# Patient Record
Sex: Male | Born: 2000 | Hispanic: No | Marital: Single | Smoking: Never smoker
Health system: Southern US, Community
[De-identification: ages and names within clinical notes are randomized; demographics above are authoritative.]

---
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
## 2021-01-24 ENCOUNTER — Emergency Department (HOSPITAL_COMMUNITY)
Admission: EM | Admit: 2021-01-24 | Discharge: 2021-01-24 | Disposition: A | Discharge status: Home / Self Care | Payer: BC Managed Care – PPO | Attending: Emergency Medicine | Admitting: Emergency Medicine

## 2021-01-24 ENCOUNTER — Emergency Department (HOSPITAL_COMMUNITY): Payer: BC Managed Care – PPO

## 2021-01-24 DIAGNOSIS — F10129 Alcohol abuse with intoxication, unspecified: Secondary | ICD-10-CM | POA: Insufficient documentation

## 2021-01-24 DIAGNOSIS — R1110 Vomiting, unspecified: Secondary | ICD-10-CM | POA: Diagnosis present

## 2021-01-24 DIAGNOSIS — F10920 Alcohol use, unspecified with intoxication, uncomplicated: Secondary | ICD-10-CM

## 2021-01-24 LAB — COMPREHENSIVE METABOLIC PANEL
ALT: 66 U/L — ABNORMAL HIGH (ref 0–44)
AST: 79 U/L — ABNORMAL HIGH (ref 15–41)
Albumin: 4.2 g/dL (ref 3.5–5.0)
Alkaline Phosphatase: 94 U/L (ref 38–126)
Anion gap: 12 (ref 5–15)
BUN: 14 mg/dL (ref 6–20)
CO2: 23 mmol/L (ref 22–32)
Calcium: 8.9 mg/dL (ref 8.9–10.3)
Chloride: 106 mmol/L (ref 98–111)
Creatinine, Ser: 1.13 mg/dL (ref 0.61–1.24)
GFR, Estimated: >60 mL/min (ref >60)
Glucose, Bld: 135 mg/dL — ABNORMAL HIGH (ref 70–99)
Potassium: 3.8 mmol/L (ref 3.5–5.1)
Sodium: 141 mmol/L (ref 135–145)
Total Bilirubin: 0.6 mg/dL (ref 0.3–1.2)
Total Protein: 6.7 g/dL (ref 6.5–8.1)

## 2021-01-24 LAB — CBC WITH DIFFERENTIAL/PLATELET
Abs Immature Granulocytes: 0.04 10*3/uL (ref 0.00–0.07)
Basophils Absolute: 0 10*3/uL (ref 0.0–0.1)
Basophils Relative: 0 %
Eosinophils Absolute: 0 10*3/uL (ref 0.0–0.5)
Eosinophils Relative: 0 %
HCT: 44 % (ref 39.0–52.0)
Hemoglobin: 14.7 g/dL (ref 13.0–17.0)
Immature Granulocytes: 0 %
Lymphocytes Relative: 9 %
Lymphs Abs: 1 10*3/uL (ref 0.7–4.0)
MCH: 30.6 pg (ref 26.0–34.0)
MCHC: 33.4 g/dL (ref 30.0–36.0)
MCV: 91.5 fL (ref 80.0–100.0)
Monocytes Absolute: 0.3 10*3/uL (ref 0.1–1.0)
Monocytes Relative: 3 %
Neutro Abs: 10.3 10*3/uL — ABNORMAL HIGH (ref 1.7–7.7)
Neutrophils Relative %: 88 %
Platelets: 246 10*3/uL (ref 150–400)
RBC: 4.81 MIL/uL (ref 4.22–5.81)
RDW: 12.3 % (ref 11.5–15.5)
WBC: 11.8 10*3/uL — ABNORMAL HIGH (ref 4.0–10.5)
nRBC: 0 % (ref 0.0–0.2)

## 2021-01-24 LAB — CBG MONITORING, ED: Glucose-Capillary: 134 mg/dL — ABNORMAL HIGH (ref 70–99)

## 2021-01-24 LAB — ACETAMINOPHEN LEVEL: Acetaminophen (Tylenol), Serum: <10 ug/mL — ABNORMAL LOW (ref 10–30)

## 2021-01-24 LAB — ETHANOL: Alcohol, Ethyl (B): 201 mg/dL — ABNORMAL HIGH (ref <10)

## 2021-01-24 LAB — SALICYLATE LEVEL: Salicylate Lvl: <7 mg/dL — ABNORMAL LOW (ref 7.0–30.0)

## 2021-01-24 NOTE — ED Triage Notes (Signed)
Pt BIB [facility] from [facility], per friends pt had multiple shots of tequila, sat down and fell backward. C-collar placed pta, vomiting x 1.

## 2021-01-24 NOTE — ED Provider Notes (Signed)
[facility] EMERGENCY DEPARTMENT Provider Note   CSN: [id number] Arrival date & time: 01/24/21  0263     History Chief Complaint  Patient presents with  . Alcohol Intoxication    Larry Patterson is a 20 y.o. male.  Patient presents to the emergency department with a chief complaint of fall.  He is brought in by EMS from [facility], where patient's friend said that he had multiple shots of tequila, sat down, and fell backward.  Friends report that he had 1 episode of vomiting.  Patient unable to contribute to his history secondary to intoxication.  Level 5 caveat applies.  The history is provided by the patient. No language interpreter was used.       No past medical history on file.  Patient Active Problem List   Diagnosis Date Noted  . Health care maintenance 05/18/2013    No past surgical history on file.     No family history on file.  Social History   Tobacco Use  . Smoking status: Never Smoker  . Smokeless tobacco: Never Used  Substance Use Topics  . Alcohol use: No  . Drug use: No    Home Medications Prior to Admission medications   Not on File    Allergies    Patient has no known allergies.  Review of Systems   Review of Systems  Unable to perform ROS: Mental status change    Physical Exam Updated Vital Signs BP 111/71 (BP Location: Right Arm)   Pulse 84   Temp (!) 97.5 F (36.4 C) (Axillary)   Resp 19   SpO2 98%   Physical Exam Vitals and nursing note reviewed.  Constitutional:      Appearance: He is well-developed.     Comments: Asleep  HENT:     Head: Normocephalic and atraumatic.  Eyes:     Conjunctiva/sclera: Conjunctivae normal.  Neck:     Comments: In c-collar Cardiovascular:     Rate and Rhythm: Normal rate and regular rhythm.     Heart sounds: No murmur heard.   Pulmonary:     Effort: Pulmonary effort is normal. No respiratory distress.     Breath sounds: Normal breath sounds.  Abdominal:      Palpations: Abdomen is soft.     Tenderness: There is no abdominal tenderness.  Musculoskeletal:     Cervical back: Neck supple.     Comments: Moving extremities, no obvious deformity  Skin:    General: Skin is warm and dry.  Neurological:     Comments: Intoxicated Asleep  Psychiatric:     Comments: Unable to assess     ED Results / Procedures / Treatments   Labs (all labs ordered are listed, but only abnormal results are displayed) Labs Reviewed  COMPREHENSIVE METABOLIC PANEL  SALICYLATE LEVEL  ACETAMINOPHEN LEVEL  ETHANOL  RAPID URINE DRUG SCREEN, HOSP PERFORMED  CBC WITH DIFFERENTIAL/PLATELET  CBG MONITORING, ED    EKG None  Radiology No results found.  Procedures Procedures   Medications Ordered in ED Medications - No data to display  ED Course  I have reviewed the triage vital signs and the nursing notes.  Pertinent labs & imaging results that were available during my care of the patient were reviewed by me and considered in my medical decision making (see chart for details).    MDM Rules/Calculators/A&P  Patient here with alcohol intoxication.  He had been drinking tequila shots with his friends tonight, when he fell backward and hit his head.  CT head and cervical spine are negative for acute traumatic injury.  He does have some cervical spondylitic changes.  Patient has been allowed to sober up for several hours in the emergency department.  Alcohol level was elevated.  Patient is now alert and responding to my questions appropriately.  He is working on finding a ride home.  He can be discharged once he finds a ride, or once he is clinically sober. Final Clinical Impression(s) / ED Diagnoses Final diagnoses:  Alcoholic intoxication without complication [facility])    Rx / DC Orders ED Discharge Orders    None       Roxy Horseman, PA-C 01/24/21 0932    Dione Booze, MD 01/24/21 [phone]

## 2022-04-05 IMAGING — CT CT HEAD W/O CM
4 series · 16 of 47 positions shown, 18 images · non-contrast
Comparison: None.

CLINICAL DATA: Heavily intoxicated, head/neck trauma with fall
posterior

EXAM:
CT HEAD WITHOUT CONTRAST
CT CERVICAL SPINE WITHOUT CONTRAST
TECHNIQUE: Multidetector CT imaging of the head and cervical spine was
performed following the standard protocol without intravenous
contrast. Multiplanar CT image reconstructions of the cervical spine
were also generated.

[Series 3: head wo · axial · 0.45mm/px · z∈[+1314,+1444]mm · 7 of 36 slices shown, 9 images]
[im 5/36  brain]
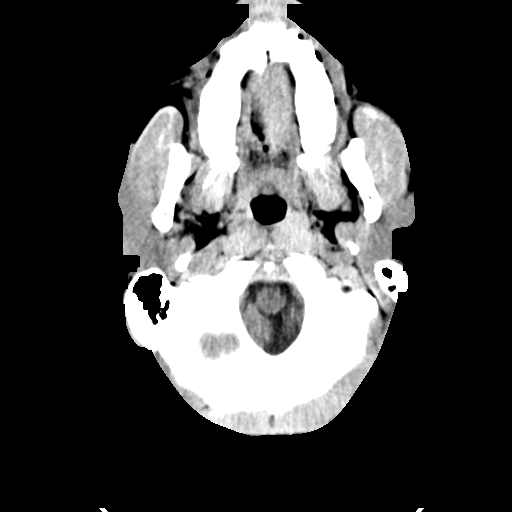
[im 5/36  bone]
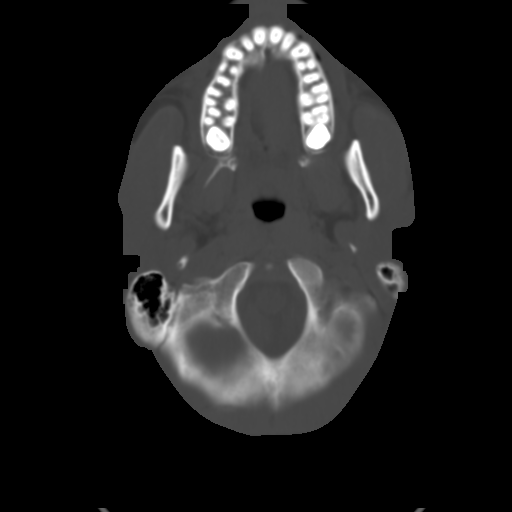
[im 9/36  brain]
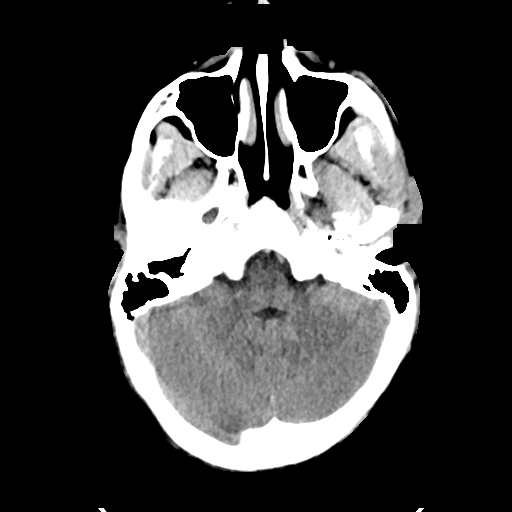
[im 14/36  brain]
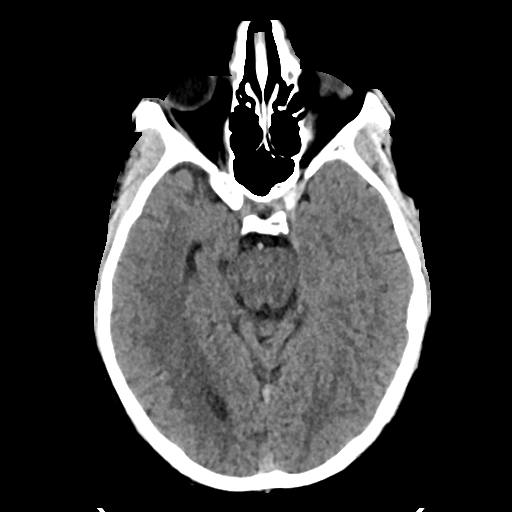
[im 18/36  brain]
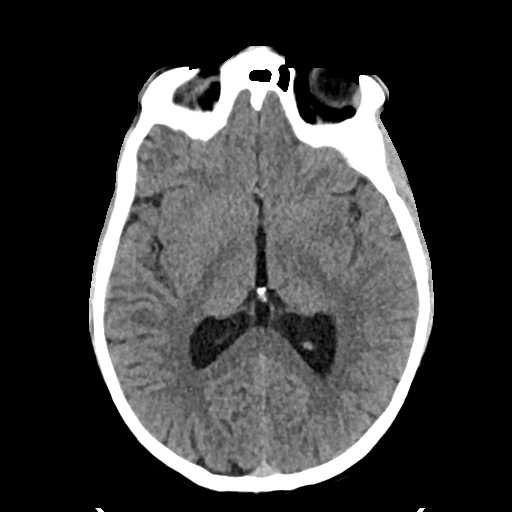
[im 22/36  brain]
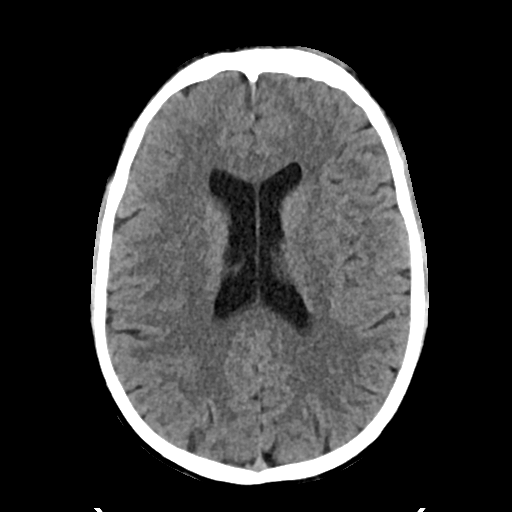
[im 22/36  bone]
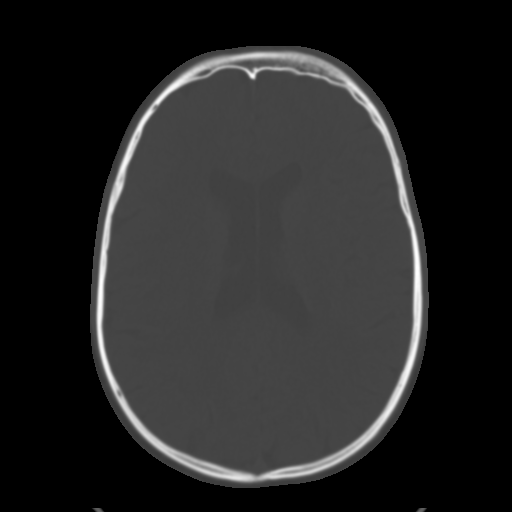
[im 27/36  brain]
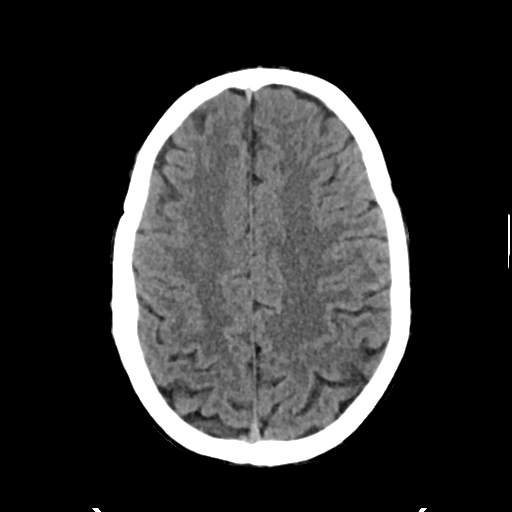
[im 31/36  brain]
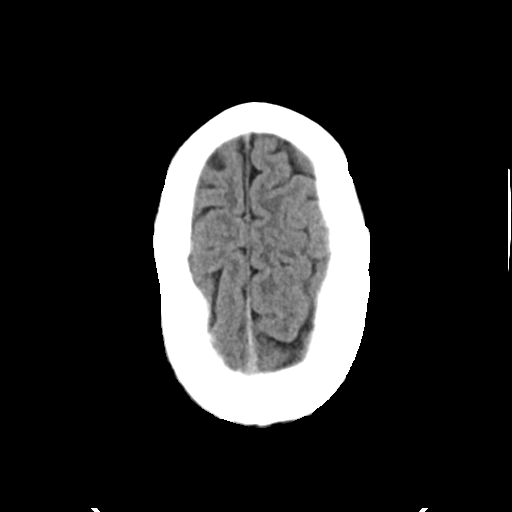

[Series 4: head bone · axial · 0.45mm/px · z∈[+1310,+1346]mm · 3 of 90 slices shown]
[im 9/90  bone]
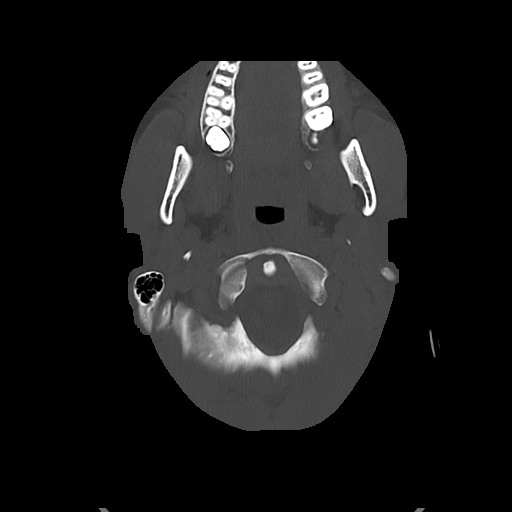
[im 18/90  bone]
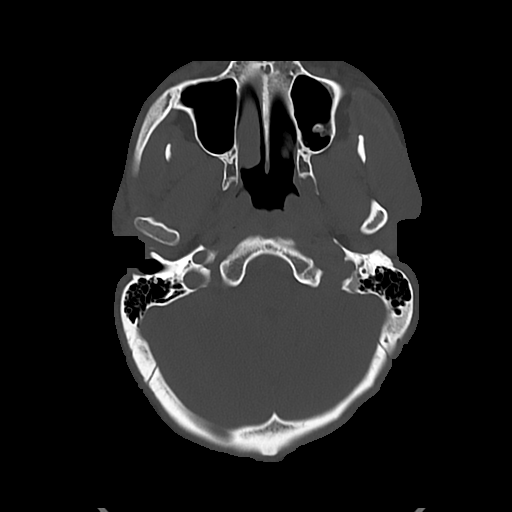
[im 27/90  bone]
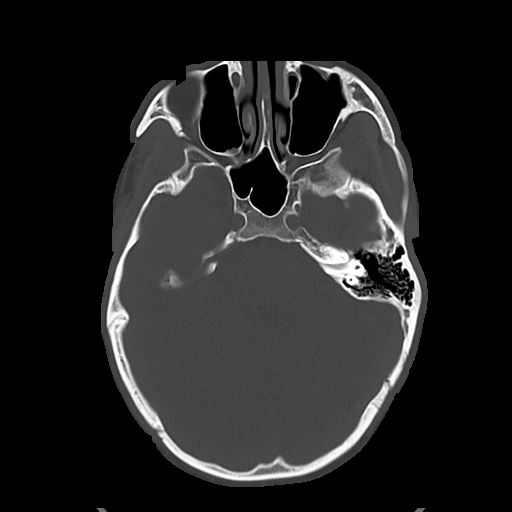

[Series 5: cor soft · coronal · 0.33mm/px · 3 of 75 slices shown]
[im 25/75  brain]
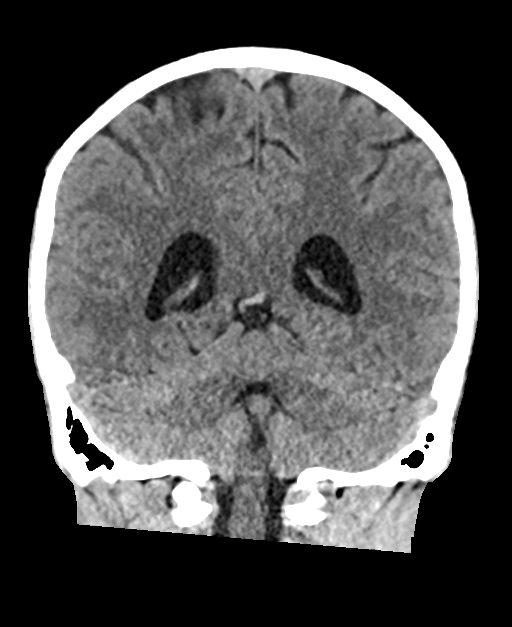
[im 33/75  brain]
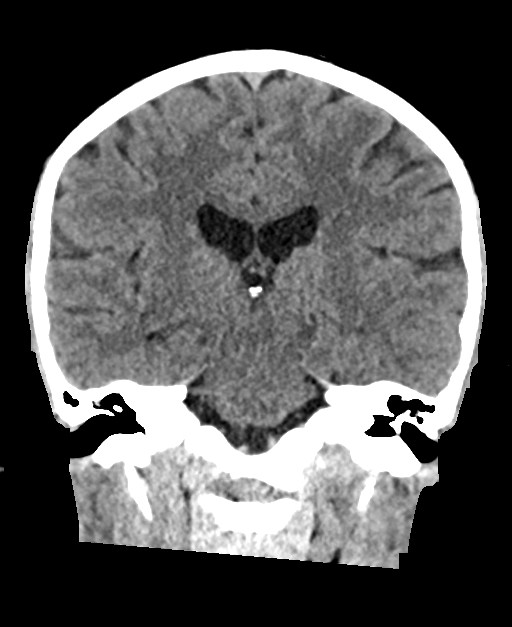
[im 42/75  brain]
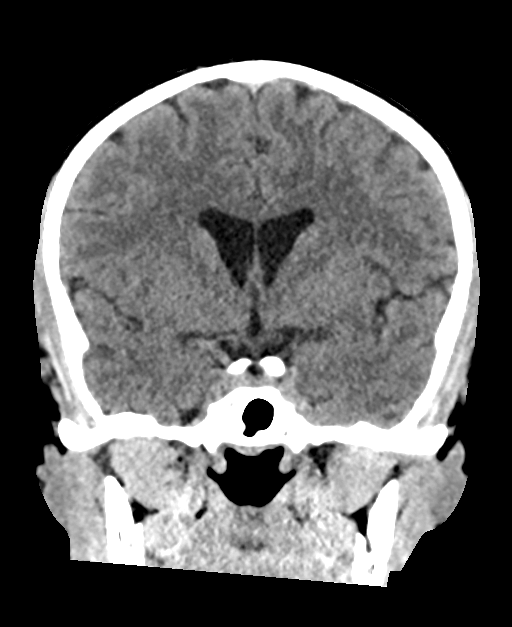

[Series 6: sag soft · sagittal · 0.40mm/px · 3 of 61 slices shown]
[im 21/61  brain]
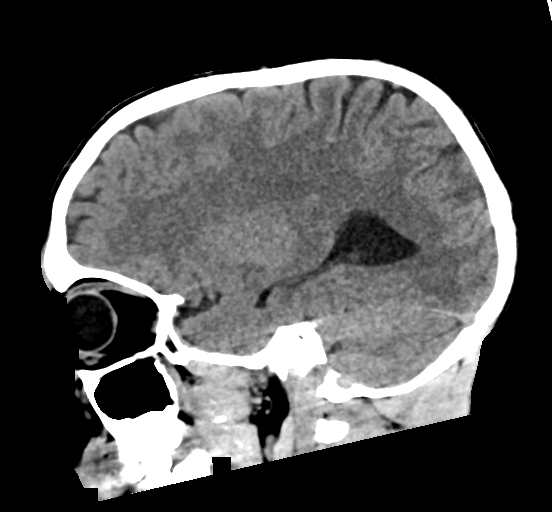
[im 31/61  brain]
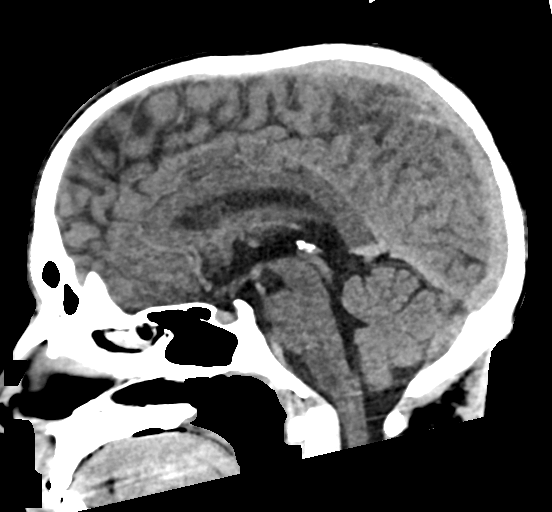
[im 41/61  brain]
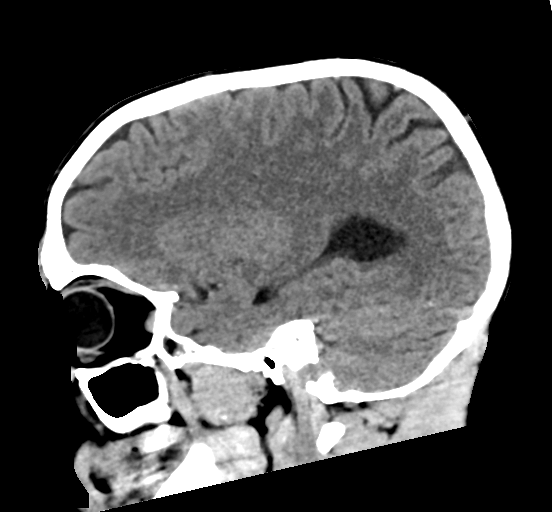

[16 of 47 positions shown; findings below may reference images not displayed]

FINDINGS: CT HEAD FINDINGS

Brain: No evidence of acute infarction, hemorrhage, hydrocephalus,
extra-axial collection, visible mass lesion or mass effect.

Vascular: No hyperdense vessel or unexpected calcification.

Skull: No calvarial fracture or suspicious osseous lesion. No
significant scalp swelling or hematoma.

Sinuses/Orbits: Paranasal sinuses and mastoid air cells are
predominantly clear. Included orbital structures are unremarkable.

Other: None.

CT CERVICAL SPINE FINDINGS

Alignment: Cervical stabilization collar is in place at the time of
exam. No evidence of traumatic listhesis. No abnormally widened,
perched or jumped facets. Normal alignment of the craniocervical and
atlantoaxial articulations.

Skull base and vertebrae: No acute skull base fracture. No vertebral
body fracture or height loss. Normal bone mineralization. No
worrisome osseous lesions.

Soft tissues and spinal canal: No pre or paravertebral fluid or
swelling. No visible canal hematoma.

Disc levels: Multilevel mild cervical spondylitic changes, maximal
C6-7 with associated Schmorl's node formation. Posterior disc
osteophyte complexes at this level result in some effacement of
ventral thecal sac. No significant central canal or foraminal
stenosis identified within the imaged levels of the spine.

Upper chest: No acute abnormality in the upper chest or imaged lung
apices.

Other: None.
IMPRESSION: 1. No acute intracranial abnormality. No calvarial fracture or
significant scalp swelling.
2. No acute fracture or traumatic listhesis of the cervical spine.
3. Multilevel cervical spondylitic changes, maximal C6-7.

## 2022-04-05 IMAGING — CT CT CERVICAL SPINE W/O CM
3 of 4 series · 12 of 33 positions shown, 14 images · non-contrast
Comparison: None.

CLINICAL DATA: Heavily intoxicated, head/neck trauma with fall
posterior

EXAM:
CT HEAD WITHOUT CONTRAST
CT CERVICAL SPINE WITHOUT CONTRAST
TECHNIQUE: Multidetector CT imaging of the head and cervical spine was
performed following the standard protocol without intravenous
contrast. Multiplanar CT image reconstructions of the cervical spine
were also generated.

[Series 8: sag bone · sagittal · 0.30mm/px · 5 of 70 slices shown, 6 images]
[im 24/70  bone]
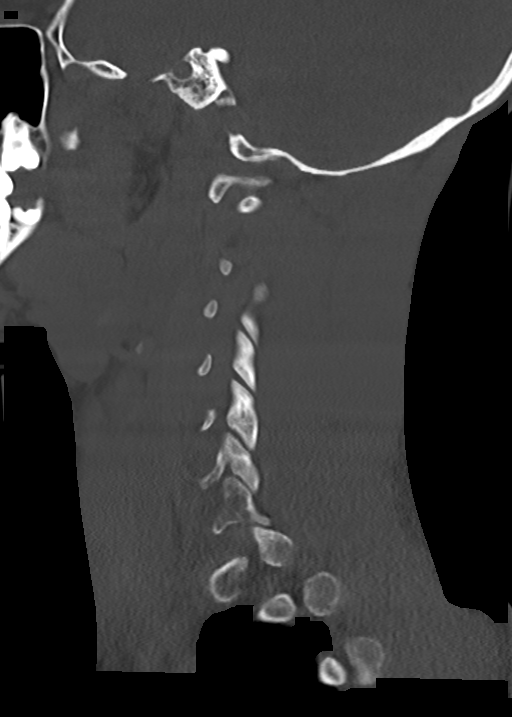
[im 29/70  bone]
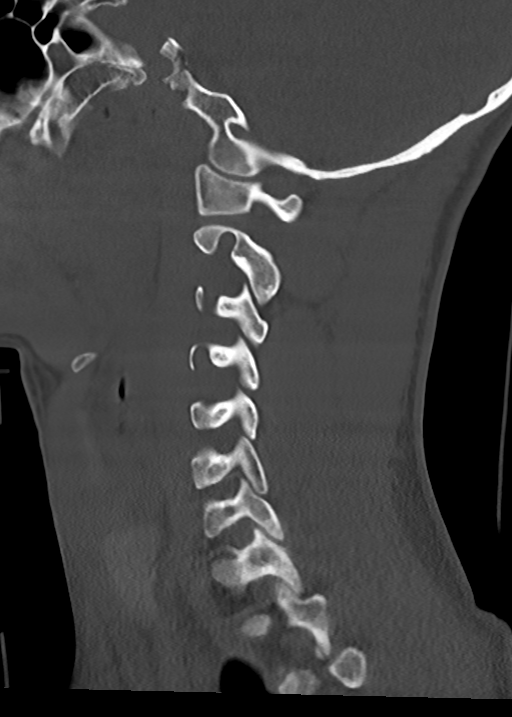
[im 35/70  soft-tissue]
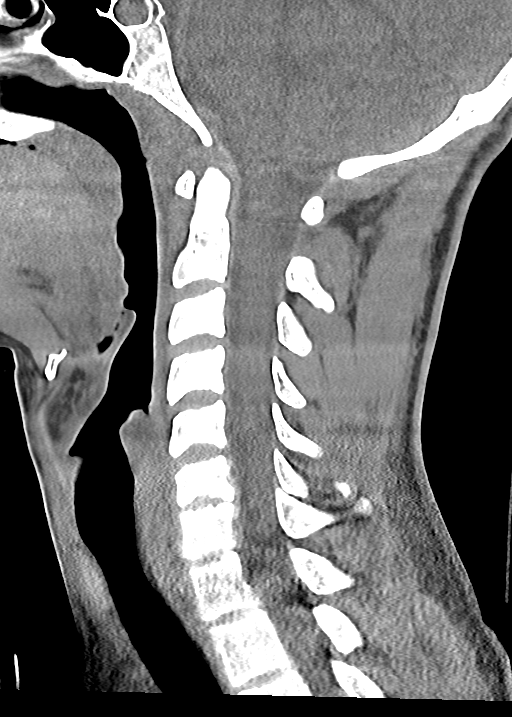
[im 35/70  bone]
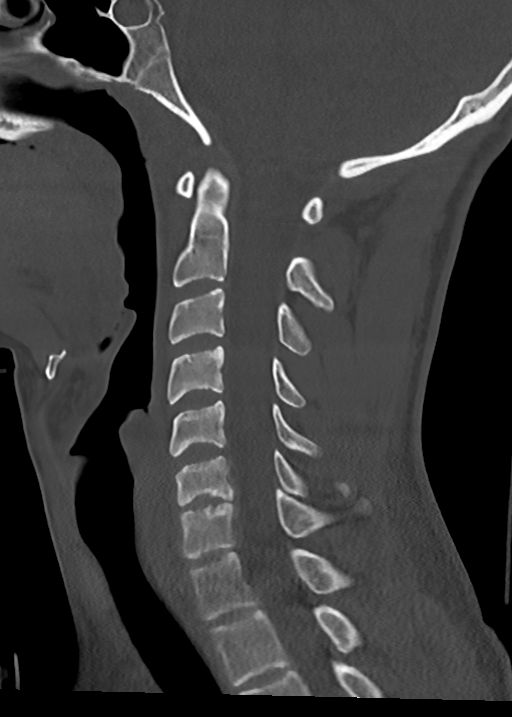
[im 41/70  bone]
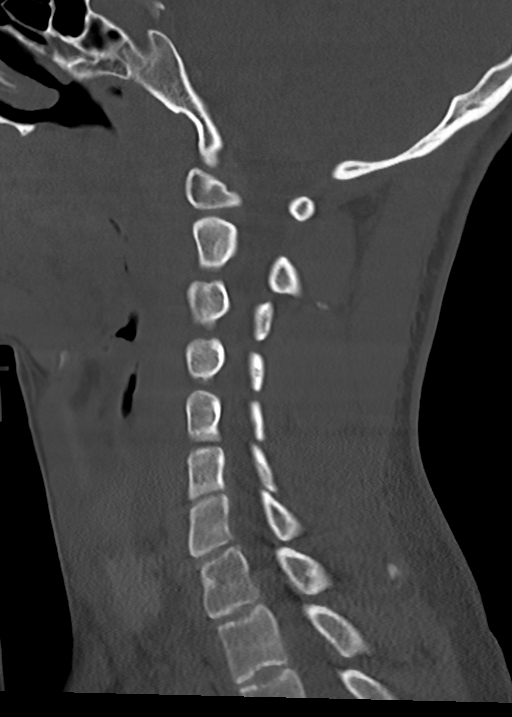
[im 47/70  bone]
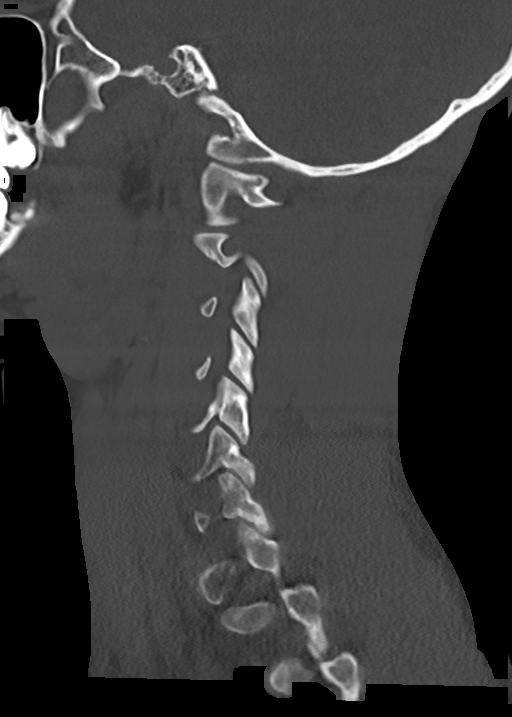

[Series 9: cor bone · coronal · 0.28mm/px · 3 of 61 slices shown]
[im 13/61  bone]
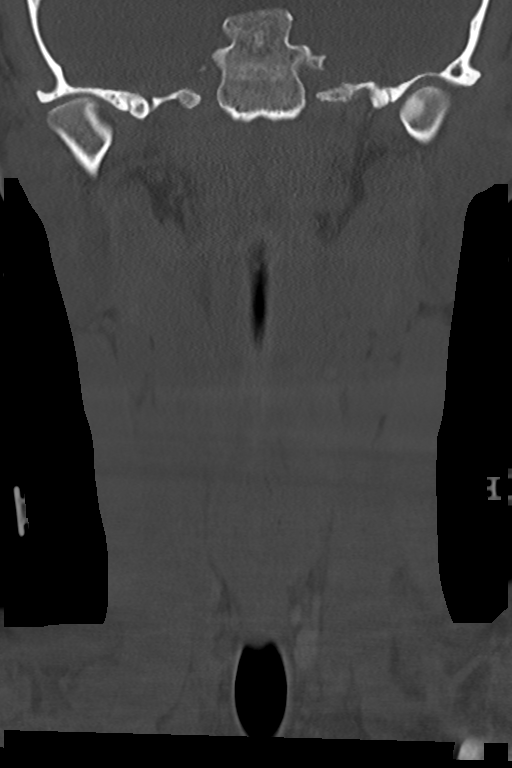
[im 25/61  bone]
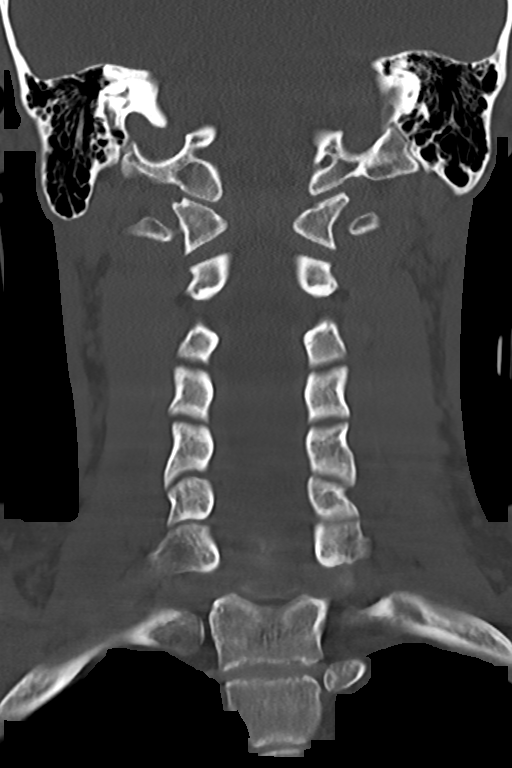
[im 37/61  bone]
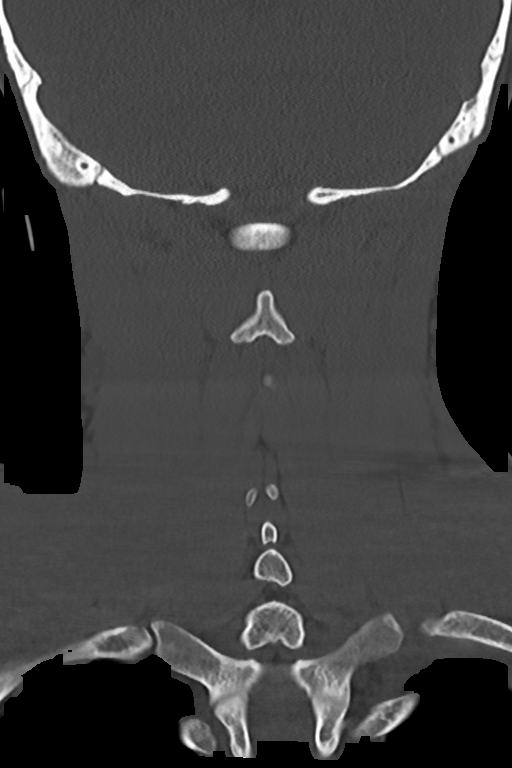

[Series 10: orthogonal axials · axial · 0.21mm/px · z∈[+1179,+1285]mm · 4 of 89 slices shown, 5 images]
[im 18/89  soft-tissue]
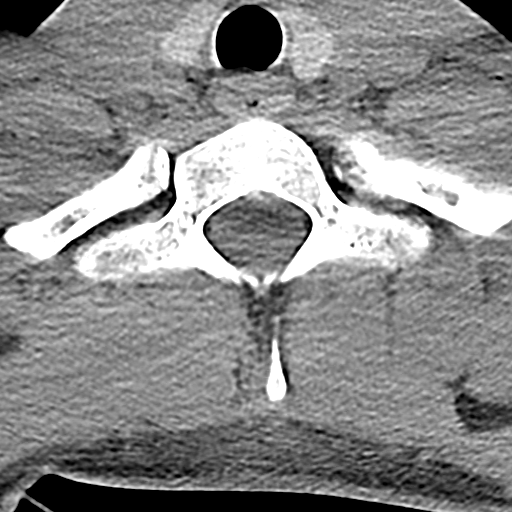
[im 18/89  bone]
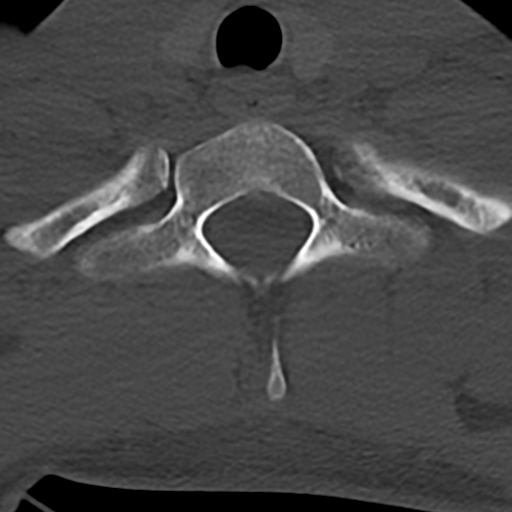
[im 36/89  bone]
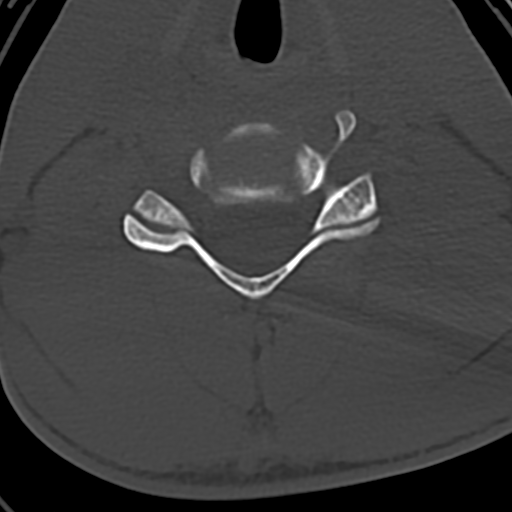
[im 53/89  bone]
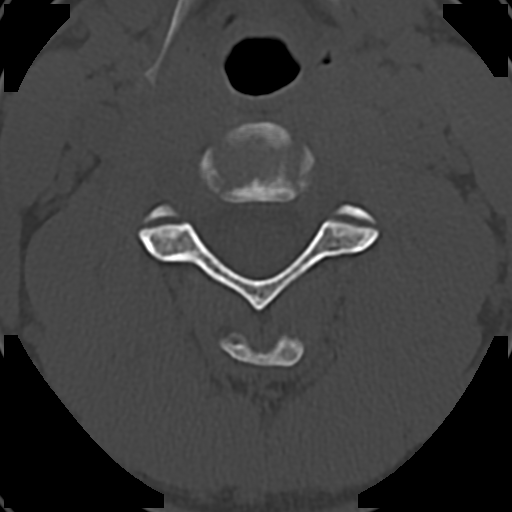
[im 71/89  bone]
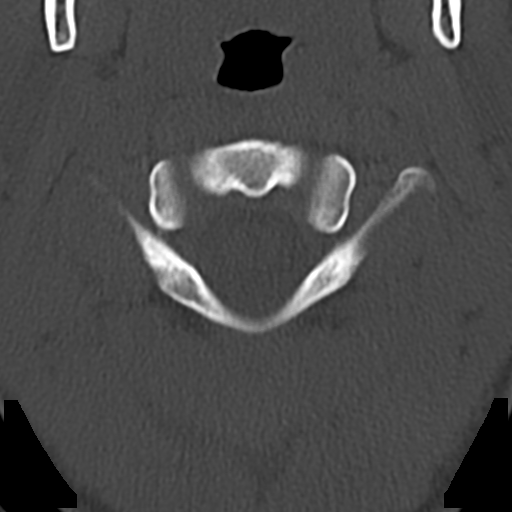

[12 of 33 positions shown; findings below may reference images not displayed]

FINDINGS: CT HEAD FINDINGS

Brain: No evidence of acute infarction, hemorrhage, hydrocephalus,
extra-axial collection, visible mass lesion or mass effect.

Vascular: No hyperdense vessel or unexpected calcification.

Skull: No calvarial fracture or suspicious osseous lesion. No
significant scalp swelling or hematoma.

Sinuses/Orbits: Paranasal sinuses and mastoid air cells are
predominantly clear. Included orbital structures are unremarkable.

Other: None.

CT CERVICAL SPINE FINDINGS

Alignment: Cervical stabilization collar is in place at the time of
exam. No evidence of traumatic listhesis. No abnormally widened,
perched or jumped facets. Normal alignment of the craniocervical and
atlantoaxial articulations.

Skull base and vertebrae: No acute skull base fracture. No vertebral
body fracture or height loss. Normal bone mineralization. No
worrisome osseous lesions.

Soft tissues and spinal canal: No pre or paravertebral fluid or
swelling. No visible canal hematoma.

Disc levels: Multilevel mild cervical spondylitic changes, maximal
C6-7 with associated Schmorl's node formation. Posterior disc
osteophyte complexes at this level result in some effacement of
ventral thecal sac. No significant central canal or foraminal
stenosis identified within the imaged levels of the spine.

Upper chest: No acute abnormality in the upper chest or imaged lung
apices.

Other: None.
IMPRESSION: 1. No acute intracranial abnormality. No calvarial fracture or
significant scalp swelling.
2. No acute fracture or traumatic listhesis of the cervical spine.
3. Multilevel cervical spondylitic changes, maximal C6-7.
